# Patient Record
Sex: Female | Born: 1935 | Race: White | Hispanic: No
Health system: Southern US, Community
[De-identification: ages and names within clinical notes are randomized; demographics above are authoritative.]

---

## 2010-10-01 ENCOUNTER — Other Ambulatory Visit (HOSPITAL_COMMUNITY): Payer: Self-pay | Admitting: Family Medicine

## 2010-10-01 DIAGNOSIS — IMO0002 Reserved for concepts with insufficient information to code with codable children: Secondary | ICD-10-CM

## 2010-10-07 ENCOUNTER — Ambulatory Visit (HOSPITAL_COMMUNITY)
Admission: RE | Admit: 2010-10-07 | Discharge: 2010-10-07 | Disposition: A | Discharge status: Home / Self Care | Payer: Medicare Other | Source: Ambulatory Visit | Attending: Family Medicine | Admitting: Family Medicine

## 2010-10-07 DIAGNOSIS — IMO0002 Reserved for concepts with insufficient information to code with codable children: Secondary | ICD-10-CM

## 2010-10-09 ENCOUNTER — Other Ambulatory Visit (HOSPITAL_COMMUNITY): Payer: Self-pay | Admitting: Interventional Radiology

## 2010-10-09 DIAGNOSIS — IMO0002 Reserved for concepts with insufficient information to code with codable children: Secondary | ICD-10-CM

## 2010-10-12 ENCOUNTER — Other Ambulatory Visit (HOSPITAL_COMMUNITY): Payer: Self-pay | Admitting: Interventional Radiology

## 2010-10-12 ENCOUNTER — Ambulatory Visit (HOSPITAL_COMMUNITY)
Admission: RE | Admit: 2010-10-12 | Discharge: 2010-10-12 | Disposition: A | Discharge status: Home / Self Care | Payer: Medicare Other | Source: Ambulatory Visit | Attending: Interventional Radiology | Admitting: Interventional Radiology

## 2010-10-12 DIAGNOSIS — Z79899 Other long term (current) drug therapy: Secondary | ICD-10-CM | POA: Insufficient documentation

## 2010-10-12 DIAGNOSIS — F17200 Nicotine dependence, unspecified, uncomplicated: Secondary | ICD-10-CM | POA: Insufficient documentation

## 2010-10-12 DIAGNOSIS — M129 Arthropathy, unspecified: Secondary | ICD-10-CM | POA: Insufficient documentation

## 2010-10-12 DIAGNOSIS — M810 Age-related osteoporosis without current pathological fracture: Secondary | ICD-10-CM | POA: Insufficient documentation

## 2010-10-12 DIAGNOSIS — Z853 Personal history of malignant neoplasm of breast: Secondary | ICD-10-CM | POA: Insufficient documentation

## 2010-10-12 DIAGNOSIS — M8448XA Pathological fracture, other site, initial encounter for fracture: Secondary | ICD-10-CM | POA: Insufficient documentation

## 2010-10-12 DIAGNOSIS — IMO0002 Reserved for concepts with insufficient information to code with codable children: Secondary | ICD-10-CM

## 2010-10-12 LAB — POCT I-STAT, CHEM 8
BUN: 22 mg/dL (ref 6–23)
Calcium, Ion: 1.07 mmol/L — ABNORMAL LOW (ref 1.12–1.32)
Chloride: 106 mEq/L (ref 96–112)
Creatinine, Ser: 1 mg/dL (ref 0.50–1.10)
Glucose, Bld: 100 mg/dL — ABNORMAL HIGH (ref 70–99)
HCT: 41 % (ref 36.0–46.0)
Hemoglobin: 13.9 g/dL (ref 12.0–15.0)
Potassium: 4 mEq/L (ref 3.5–5.1)
Sodium: 138 mEq/L (ref 135–145)
TCO2: 26 mmol/L (ref 0–100)

## 2010-10-12 LAB — APTT: aPTT: 32 seconds (ref 24–37)

## 2010-10-12 LAB — CBC
Hemoglobin: 13.6 g/dL (ref 12.0–15.0)
MCH: 30 pg (ref 26.0–34.0)
MCHC: 33.4 g/dL (ref 30.0–36.0)
RDW: 13.5 % (ref 11.5–15.5)

## 2010-10-12 LAB — PROTIME-INR: Prothrombin Time: 12.8 seconds (ref 11.6–15.2)

## 2010-10-12 MED ORDER — IOHEXOL 300 MG/ML  SOLN
50.0000 mL | Freq: Once | INTRAMUSCULAR | Status: AC | PRN
  Administered 2010-10-12: 3 mL via INTRAVENOUS

## 2010-10-21 ENCOUNTER — Other Ambulatory Visit (HOSPITAL_COMMUNITY): Payer: Self-pay | Admitting: Interventional Radiology

## 2010-10-21 DIAGNOSIS — IMO0002 Reserved for concepts with insufficient information to code with codable children: Secondary | ICD-10-CM

## 2010-10-26 ENCOUNTER — Ambulatory Visit (HOSPITAL_COMMUNITY)
Admission: RE | Admit: 2010-10-26 | Discharge: 2010-10-26 | Disposition: A | Discharge status: Home / Self Care | Payer: Medicare Other | Source: Ambulatory Visit | Attending: Interventional Radiology | Admitting: Interventional Radiology

## 2010-10-26 DIAGNOSIS — IMO0002 Reserved for concepts with insufficient information to code with codable children: Secondary | ICD-10-CM

## 2010-11-06 ENCOUNTER — Other Ambulatory Visit (HOSPITAL_COMMUNITY): Payer: Self-pay | Admitting: Interventional Radiology

## 2010-11-06 DIAGNOSIS — M25559 Pain in unspecified hip: Secondary | ICD-10-CM

## 2010-11-06 DIAGNOSIS — S3210XA Unspecified fracture of sacrum, initial encounter for closed fracture: Secondary | ICD-10-CM

## 2010-11-09 ENCOUNTER — Ambulatory Visit (HOSPITAL_COMMUNITY)
Admission: RE | Admit: 2010-11-09 | Discharge: 2010-11-09 | Disposition: A | Discharge status: Home / Self Care | Payer: Medicare Other | Source: Ambulatory Visit | Attending: Interventional Radiology | Admitting: Interventional Radiology

## 2010-11-09 ENCOUNTER — Encounter (HOSPITAL_COMMUNITY)
Admission: RE | Admit: 2010-11-09 | Discharge: 2010-11-09 | Disposition: A | Discharge status: Home / Self Care | Payer: Medicare Other | Source: Ambulatory Visit | Attending: Interventional Radiology | Admitting: Interventional Radiology

## 2010-11-09 DIAGNOSIS — M25559 Pain in unspecified hip: Secondary | ICD-10-CM

## 2010-11-09 DIAGNOSIS — S3210XA Unspecified fracture of sacrum, initial encounter for closed fracture: Secondary | ICD-10-CM

## 2010-11-09 MED ORDER — TECHNETIUM TC 99M MEDRONATE IV KIT
25.0000 | PACK | Freq: Once | INTRAVENOUS | Status: AC | PRN
  Administered 2010-11-09: 27 via INTRAVENOUS

## 2010-12-09 ENCOUNTER — Other Ambulatory Visit (HOSPITAL_COMMUNITY): Payer: Self-pay | Admitting: Interventional Radiology

## 2010-12-09 DIAGNOSIS — M549 Dorsalgia, unspecified: Secondary | ICD-10-CM

## 2010-12-16 ENCOUNTER — Ambulatory Visit (HOSPITAL_COMMUNITY)
Admission: RE | Admit: 2010-12-16 | Discharge: 2010-12-16 | Disposition: A | Discharge status: Home / Self Care | Payer: Medicare Other | Source: Ambulatory Visit | Attending: Interventional Radiology | Admitting: Interventional Radiology

## 2010-12-16 DIAGNOSIS — S32009A Unspecified fracture of unspecified lumbar vertebra, initial encounter for closed fracture: Secondary | ICD-10-CM | POA: Insufficient documentation

## 2010-12-16 DIAGNOSIS — M549 Dorsalgia, unspecified: Secondary | ICD-10-CM | POA: Insufficient documentation

## 2010-12-16 DIAGNOSIS — M5126 Other intervertebral disc displacement, lumbar region: Secondary | ICD-10-CM | POA: Insufficient documentation

## 2010-12-16 DIAGNOSIS — X58XXXA Exposure to other specified factors, initial encounter: Secondary | ICD-10-CM | POA: Insufficient documentation

## 2010-12-16 DIAGNOSIS — G589 Mononeuropathy, unspecified: Secondary | ICD-10-CM | POA: Insufficient documentation

## 2010-12-16 DIAGNOSIS — M519 Unspecified thoracic, thoracolumbar and lumbosacral intervertebral disc disorder: Secondary | ICD-10-CM | POA: Insufficient documentation

## 2010-12-16 DIAGNOSIS — M25559 Pain in unspecified hip: Secondary | ICD-10-CM | POA: Insufficient documentation

## 2010-12-18 ENCOUNTER — Other Ambulatory Visit (HOSPITAL_COMMUNITY): Payer: Self-pay | Admitting: Interventional Radiology

## 2010-12-18 DIAGNOSIS — S32040A Wedge compression fracture of fourth lumbar vertebra, initial encounter for closed fracture: Secondary | ICD-10-CM

## 2010-12-24 ENCOUNTER — Ambulatory Visit (HOSPITAL_COMMUNITY)
Admission: RE | Admit: 2010-12-24 | Discharge: 2010-12-24 | Disposition: A | Discharge status: Home / Self Care | Payer: Medicare Other | Source: Ambulatory Visit | Attending: Interventional Radiology | Admitting: Interventional Radiology

## 2010-12-24 ENCOUNTER — Other Ambulatory Visit: Payer: Self-pay | Admitting: Interventional Radiology

## 2010-12-24 DIAGNOSIS — Z01812 Encounter for preprocedural laboratory examination: Secondary | ICD-10-CM | POA: Insufficient documentation

## 2010-12-24 DIAGNOSIS — M8448XA Pathological fracture, other site, initial encounter for fracture: Secondary | ICD-10-CM | POA: Insufficient documentation

## 2010-12-24 DIAGNOSIS — F17200 Nicotine dependence, unspecified, uncomplicated: Secondary | ICD-10-CM | POA: Insufficient documentation

## 2010-12-24 DIAGNOSIS — Z853 Personal history of malignant neoplasm of breast: Secondary | ICD-10-CM | POA: Insufficient documentation

## 2010-12-24 DIAGNOSIS — I6529 Occlusion and stenosis of unspecified carotid artery: Secondary | ICD-10-CM | POA: Insufficient documentation

## 2010-12-24 DIAGNOSIS — M129 Arthropathy, unspecified: Secondary | ICD-10-CM | POA: Insufficient documentation

## 2010-12-24 DIAGNOSIS — S32040A Wedge compression fracture of fourth lumbar vertebra, initial encounter for closed fracture: Secondary | ICD-10-CM

## 2010-12-24 DIAGNOSIS — M810 Age-related osteoporosis without current pathological fracture: Secondary | ICD-10-CM | POA: Insufficient documentation

## 2010-12-24 LAB — CBC
HCT: 37.2 % (ref 36.0–46.0)
Hemoglobin: 12.1 g/dL (ref 12.0–15.0)
MCH: 28.9 pg (ref 26.0–34.0)
MCHC: 32.5 g/dL (ref 30.0–36.0)

## 2010-12-24 LAB — PROTIME-INR: Prothrombin Time: 13.1 seconds (ref 11.6–15.2)

## 2010-12-24 LAB — POCT I-STAT, CHEM 8
BUN: 26 mg/dL — ABNORMAL HIGH (ref 6–23)
Calcium, Ion: 1.18 mmol/L (ref 1.12–1.32)
Hemoglobin: 12.9 g/dL (ref 12.0–15.0)
TCO2: 29 mmol/L (ref 0–100)

## 2010-12-24 LAB — APTT: aPTT: 30 seconds (ref 24–37)

## 2010-12-24 IMAGING — XA IR KYPHOPLASTY LUMBAR INIT
1 series · 14 of 22 positions shown · non-contrast
Comparison: MRI scan of the lumbosacral spine of [DATE].

CLINICAL DATA: New onset severe low back pain secondary to
compression fracture at L4.

LUMBAR KYPHOPLASTY AT L4

[Series 300: spine · 14 of 22 slices shown]
[im 1/22]
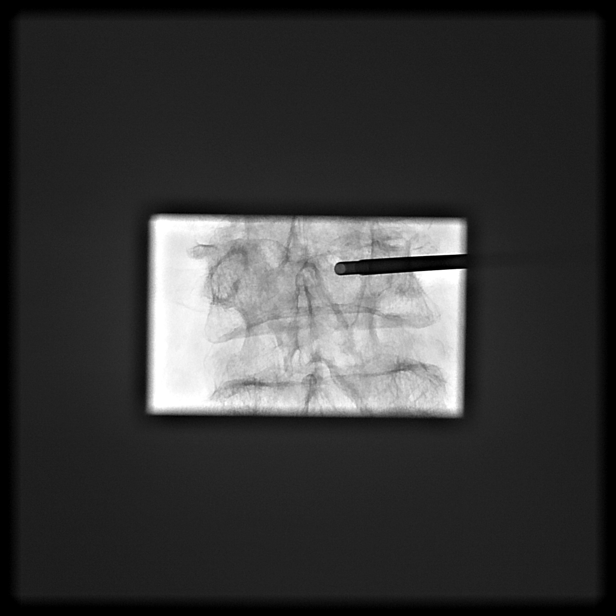
[im 3/22]
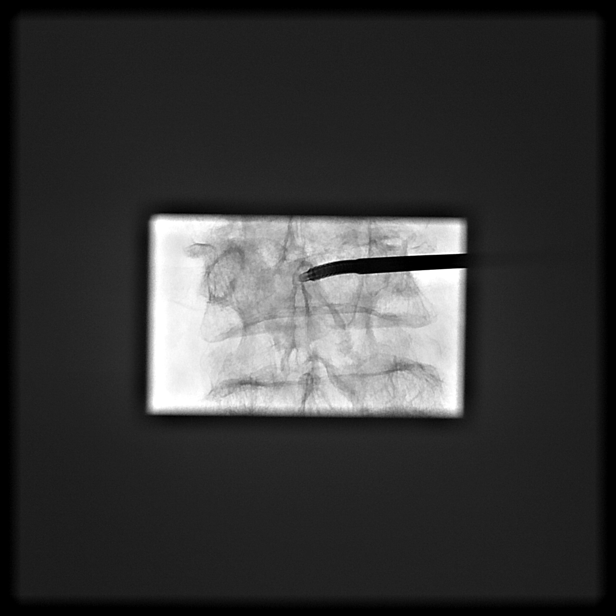
[im 4/22]
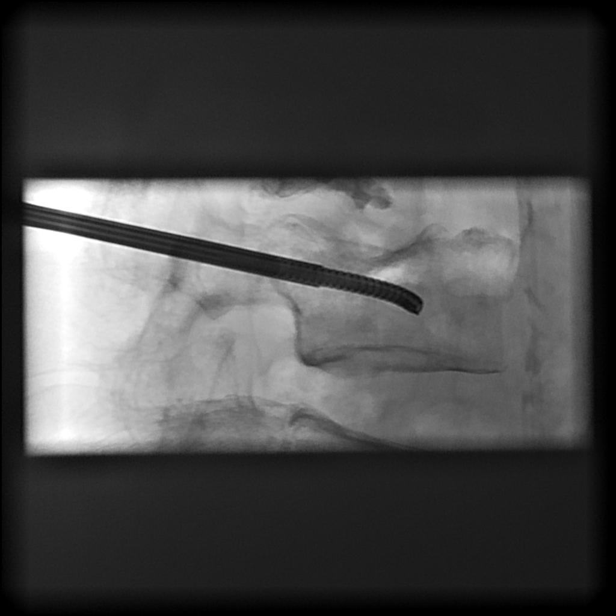
[im 6/22]
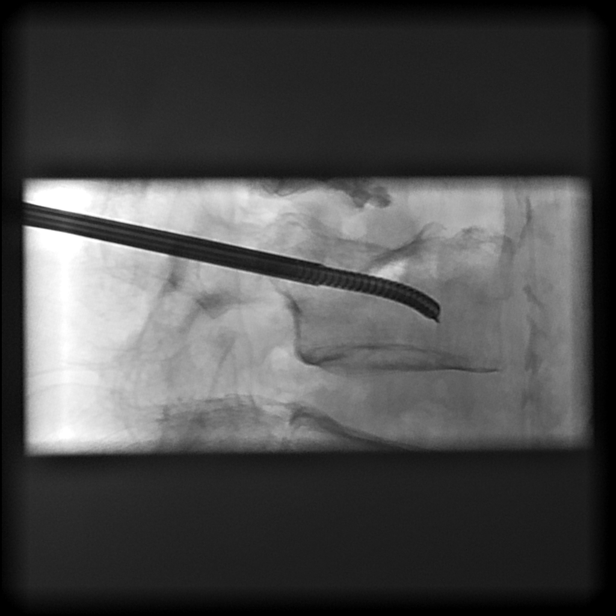
[im 8/22]
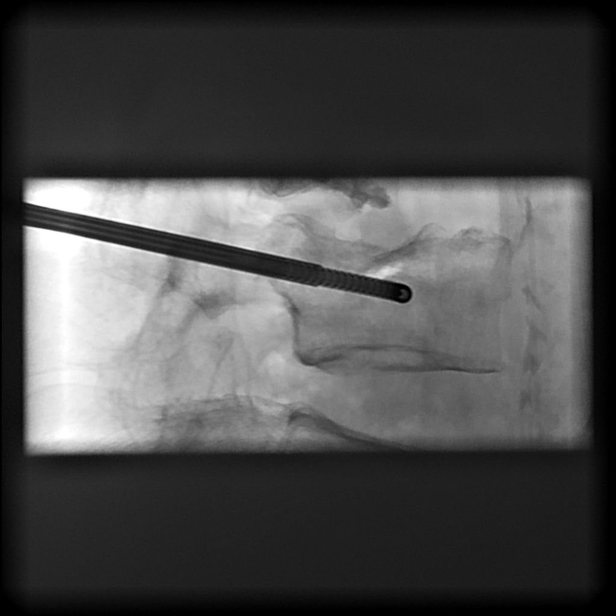
[im 9/22]
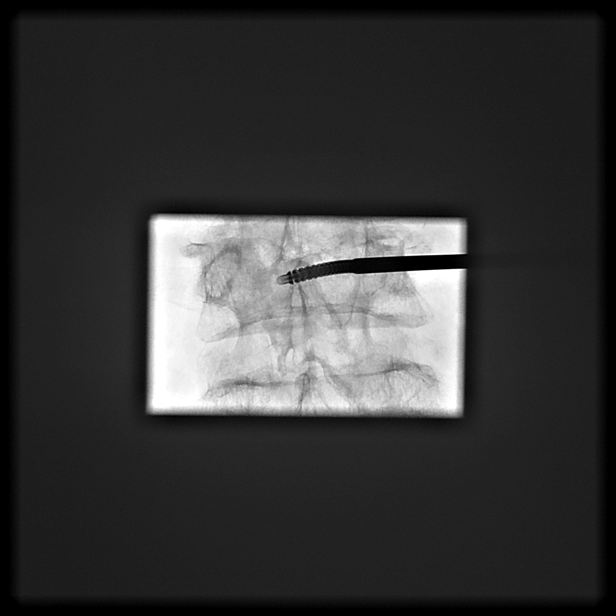
[im 11/22]
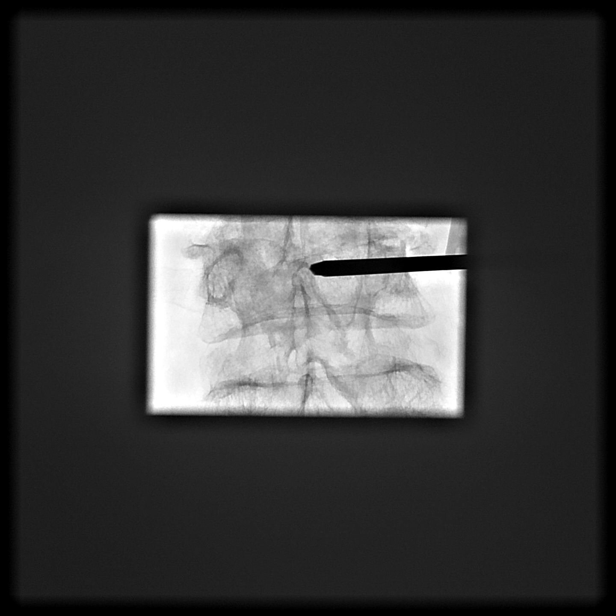
[im 12/22]
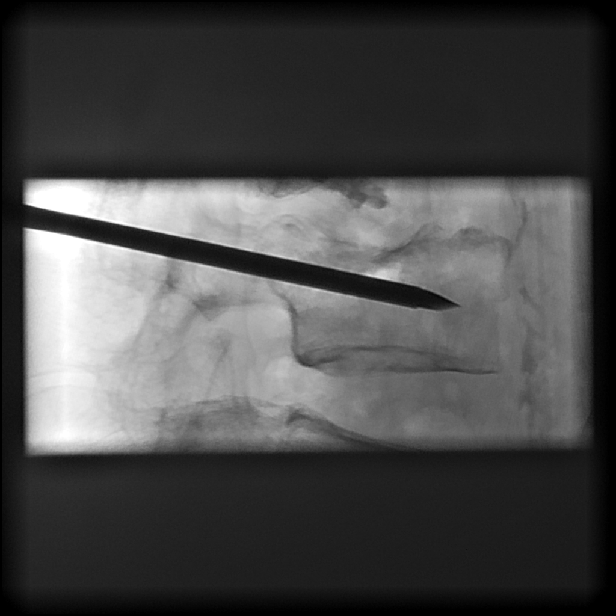
[im 14/22]
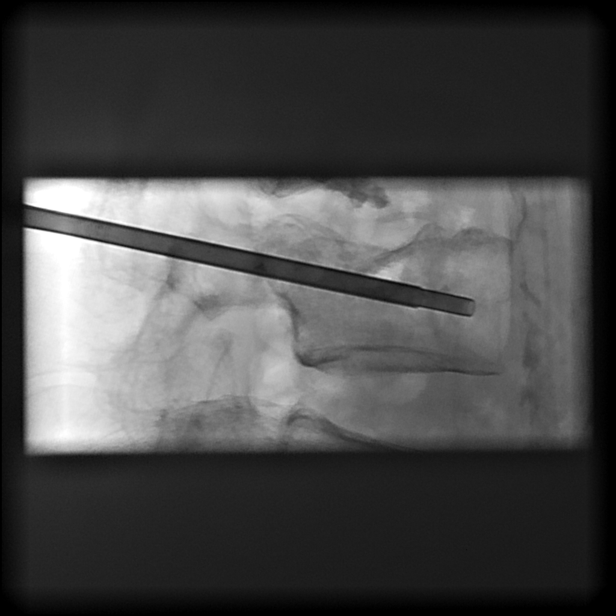
[im 15/22]
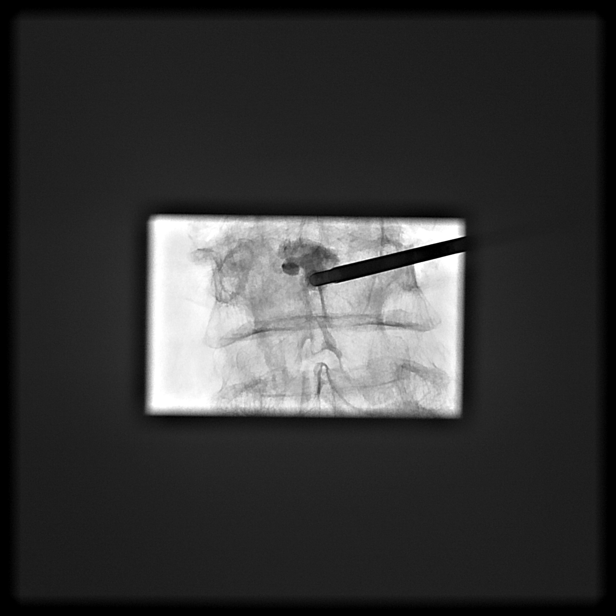
[im 17/22]
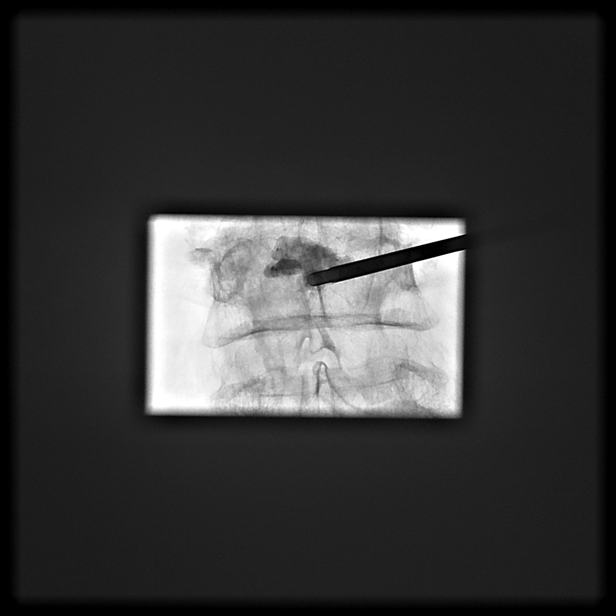
[im 19/22]
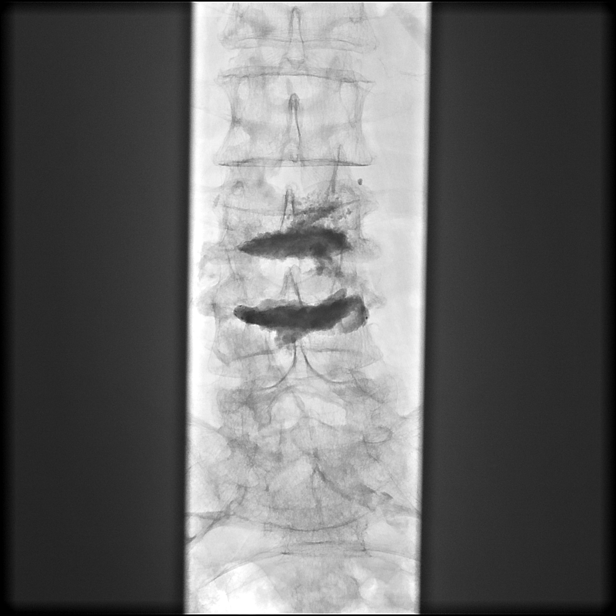
[im 20/22]
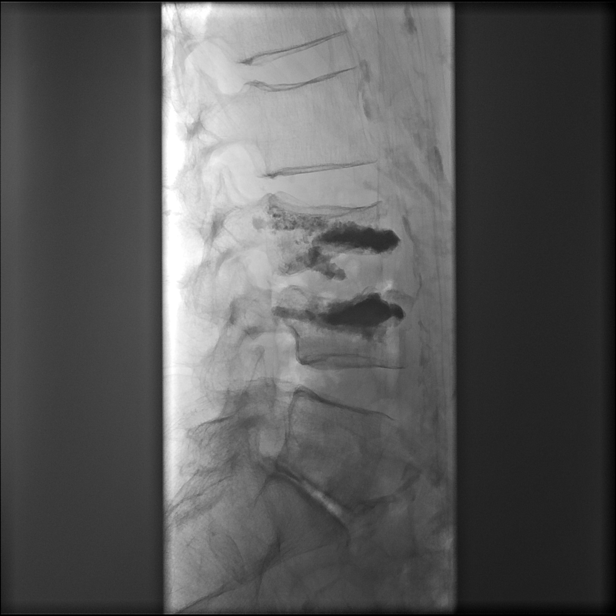
[im 22/22]
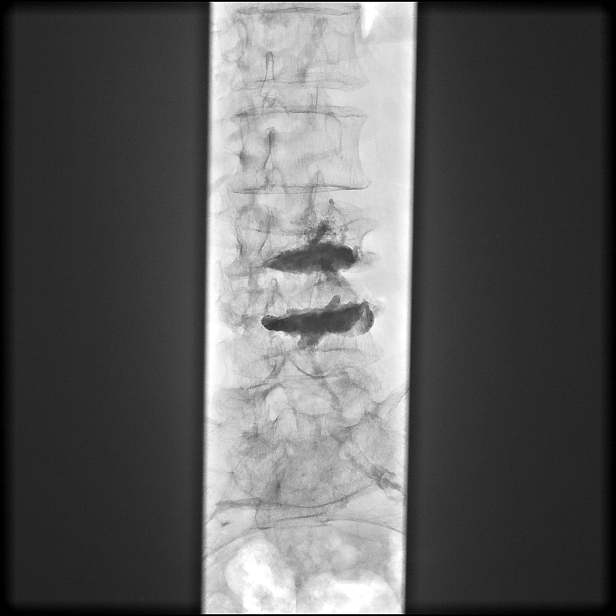

[14 of 22 positions shown; findings below may reference images not displayed]

Following a full explanation of the procedure along with the
potential associated complications, an informed witnessed consent
was obtained.

The patient was laid prone on the fluoroscopic table.  The skin
overlying the lumbar region was then prepped and draped in the
usual sterile fashion.

The skin overlying the right pedicle at L4 was then identified and
infiltrated with 0.25% bupivacaine to the level of the pedicle.

A 10.5-gauge DFine spine needle was then advanced into the
posterior third of L4 using biplane intermittent fluoroscopy.

Through this, a DFine core biopsy device was then advanced distal
to the tip of the needle, and using a 20 ml syringe, a core sample
was obtained for pathological analysis.

Through the DFine working needle, a DFine void-creating device was
then advanced and manipulated using biplane intermittent
fluoroscopy to create a void.  This was then removed.

The needle itself was then advanced to the junction of the anterior
one-third and the middle third of L4.

Methylmethacrylate mixture was then reconstituted with Tobramycin
in the DFine mixing system.  This was then loaded onto the DFine
injector device.

Using biplane intermittent fluoroscopy, pulsed delivery of the
methylmethacrylate mixture was then performed at L4 with excellent
filling in the AP and lateral projections.

There was no extrusion noted into disc spaces or posteriorly into
the spinal canal.  No paraspinous venous contamination was seen.

The working needle was then retrieved.  Hemostasis was achieved at
the skin entry site.

The patient tolerated the procedure well.  There were no acute
complications.

Medications utilized: Versed 2 mg IV.  Fentanyl 50 mcg IV.

IMPRESSION
1.  Status post fluoroscopic-guided needle placement for deep core
bone biopsy at L4.
2.  Status post vertebral body augmentation for painful compression
fracture at L4 using the DFine kyphoplasty technique.

## 2010-12-28 ENCOUNTER — Other Ambulatory Visit (HOSPITAL_COMMUNITY): Payer: Self-pay | Admitting: Interventional Radiology

## 2010-12-28 DIAGNOSIS — IMO0002 Reserved for concepts with insufficient information to code with codable children: Secondary | ICD-10-CM

## 2011-01-11 ENCOUNTER — Ambulatory Visit (HOSPITAL_COMMUNITY)
Admission: RE | Admit: 2011-01-11 | Discharge: 2011-01-11 | Disposition: A | Discharge status: Home / Self Care | Payer: Medicare Other | Source: Ambulatory Visit | Attending: Interventional Radiology | Admitting: Interventional Radiology

## 2011-01-11 DIAGNOSIS — IMO0002 Reserved for concepts with insufficient information to code with codable children: Secondary | ICD-10-CM

## 2013-04-11 ENCOUNTER — Telehealth: Payer: Self-pay

## 2013-04-11 NOTE — Telephone Encounter (Signed)
Patient past [place] @ [address] [place] [facility] [employer]

## 2013-04-29 DEATH — deceased
